# Patient Record
Sex: Female | Born: 1955 | Race: White | Hispanic: No | Marital: Married | Smoking: Never smoker
Health system: Southern US, Community
[De-identification: ages and names within clinical notes are randomized; demographics above are authoritative.]

## PROBLEM LIST (undated history)

## (undated) DIAGNOSIS — F419 Anxiety disorder, unspecified: Secondary | ICD-10-CM

## (undated) DIAGNOSIS — F329 Major depressive disorder, single episode, unspecified: Secondary | ICD-10-CM

## (undated) DIAGNOSIS — F32A Depression, unspecified: Secondary | ICD-10-CM

## (undated) HISTORY — DX: Anxiety disorder, unspecified: F41.9

## (undated) HISTORY — DX: Major depressive disorder, single episode, unspecified: F32.9

## (undated) HISTORY — PX: TOTAL ABDOMINAL HYSTERECTOMY: SHX209

## (undated) HISTORY — DX: Depression, unspecified: F32.A

---

## 2000-12-11 ENCOUNTER — Other Ambulatory Visit: Admission: RE | Admit: 2000-12-11 | Discharge: 2000-12-11 | Payer: Self-pay | Admitting: Gynecology

## 2002-01-07 ENCOUNTER — Other Ambulatory Visit: Admission: RE | Admit: 2002-01-07 | Discharge: 2002-01-07 | Payer: Self-pay | Admitting: Gynecology

## 2002-04-03 ENCOUNTER — Encounter: Payer: Self-pay | Admitting: Gynecology

## 2002-04-03 ENCOUNTER — Encounter: Admission: RE | Admit: 2002-04-03 | Discharge: 2002-04-03 | Payer: Self-pay | Admitting: Gynecology

## 2003-02-19 ENCOUNTER — Other Ambulatory Visit: Admission: RE | Admit: 2003-02-19 | Discharge: 2003-02-19 | Payer: Self-pay | Admitting: Gynecology

## 2004-05-03 ENCOUNTER — Other Ambulatory Visit: Admission: RE | Admit: 2004-05-03 | Discharge: 2004-05-03 | Payer: Self-pay | Admitting: Gynecology

## 2005-04-17 ENCOUNTER — Encounter: Admission: RE | Admit: 2005-04-17 | Discharge: 2005-04-17 | Payer: Self-pay | Admitting: Internal Medicine

## 2005-05-09 ENCOUNTER — Other Ambulatory Visit: Admission: RE | Admit: 2005-05-09 | Discharge: 2005-05-09 | Payer: Self-pay | Admitting: Gynecology

## 2005-08-10 ENCOUNTER — Ambulatory Visit: Payer: Self-pay | Admitting: Internal Medicine

## 2005-09-14 ENCOUNTER — Encounter (INDEPENDENT_AMBULATORY_CARE_PROVIDER_SITE_OTHER): Payer: Self-pay | Admitting: *Deleted

## 2005-09-14 ENCOUNTER — Ambulatory Visit: Payer: Self-pay | Admitting: Internal Medicine

## 2005-10-04 ENCOUNTER — Observation Stay (HOSPITAL_COMMUNITY): Admission: EM | Admit: 2005-10-04 | Discharge: 2005-10-05 | Payer: Self-pay | Admitting: Emergency Medicine

## 2005-10-04 ENCOUNTER — Ambulatory Visit: Payer: Self-pay | Admitting: Internal Medicine

## 2005-10-11 ENCOUNTER — Ambulatory Visit: Payer: Self-pay | Admitting: Internal Medicine

## 2005-11-07 ENCOUNTER — Ambulatory Visit: Payer: Self-pay | Admitting: Internal Medicine

## 2006-05-15 ENCOUNTER — Other Ambulatory Visit: Admission: RE | Admit: 2006-05-15 | Discharge: 2006-05-15 | Payer: Self-pay | Admitting: Gynecology

## 2007-07-08 ENCOUNTER — Other Ambulatory Visit: Admission: RE | Admit: 2007-07-08 | Discharge: 2007-07-08 | Payer: Self-pay | Admitting: Gynecology

## 2007-11-19 ENCOUNTER — Encounter: Payer: Self-pay | Admitting: Emergency Medicine

## 2007-11-19 ENCOUNTER — Ambulatory Visit: Payer: Self-pay | Admitting: *Deleted

## 2007-11-20 ENCOUNTER — Ambulatory Visit: Payer: Self-pay | Admitting: Cardiology

## 2007-11-20 ENCOUNTER — Inpatient Hospital Stay (HOSPITAL_COMMUNITY): Admission: EM | Admit: 2007-11-20 | Discharge: 2007-11-20 | Payer: Self-pay | Admitting: Cardiology

## 2007-11-25 ENCOUNTER — Encounter: Admission: RE | Admit: 2007-11-25 | Discharge: 2008-01-21 | Payer: Self-pay | Admitting: Surgery

## 2007-12-04 ENCOUNTER — Encounter: Payer: Self-pay | Admitting: Cardiology

## 2007-12-04 ENCOUNTER — Ambulatory Visit: Payer: Self-pay

## 2008-01-27 ENCOUNTER — Ambulatory Visit: Payer: Self-pay | Admitting: Cardiology

## 2008-12-28 ENCOUNTER — Ambulatory Visit: Payer: Self-pay | Admitting: Internal Medicine

## 2008-12-31 ENCOUNTER — Telehealth: Payer: Self-pay | Admitting: Internal Medicine

## 2009-01-07 ENCOUNTER — Encounter: Payer: Self-pay | Admitting: Internal Medicine

## 2009-01-07 ENCOUNTER — Ambulatory Visit: Payer: Self-pay | Admitting: Internal Medicine

## 2009-01-12 ENCOUNTER — Encounter: Payer: Self-pay | Admitting: Internal Medicine

## 2010-09-05 ENCOUNTER — Other Ambulatory Visit: Payer: Self-pay | Admitting: Gynecology

## 2010-09-21 ENCOUNTER — Other Ambulatory Visit (HOSPITAL_COMMUNITY): Payer: Self-pay | Admitting: Urology

## 2010-09-21 DIAGNOSIS — R3129 Other microscopic hematuria: Secondary | ICD-10-CM

## 2010-09-29 ENCOUNTER — Ambulatory Visit (HOSPITAL_COMMUNITY)
Admission: RE | Admit: 2010-09-29 | Discharge: 2010-09-29 | Disposition: A | Discharge status: Home / Self Care | Payer: BC Managed Care – PPO | Source: Ambulatory Visit | Attending: Urology | Admitting: Urology

## 2010-09-29 DIAGNOSIS — N289 Disorder of kidney and ureter, unspecified: Secondary | ICD-10-CM | POA: Insufficient documentation

## 2010-09-29 DIAGNOSIS — K8020 Calculus of gallbladder without cholecystitis without obstruction: Secondary | ICD-10-CM | POA: Insufficient documentation

## 2010-09-29 DIAGNOSIS — R3129 Other microscopic hematuria: Secondary | ICD-10-CM

## 2010-09-29 MED ORDER — GADOBENATE DIMEGLUMINE 529 MG/ML IV SOLN
14.0000 mL | Freq: Once | INTRAVENOUS | Status: AC | PRN
  Administered 2010-09-29: 14 mL via INTRAVENOUS

## 2010-12-12 NOTE — Discharge Summary (Signed)
Natalie Little, Natalie Little                ACCOUNT NO.:  [id number]   MEDICAL RECORD NO.:  [id number]          PATIENT TYPE:  INP   LOCATION:  4731                         FACILITY:  [facility]   PHYSICIAN:  Jonelle Sidle, MD DATE OF BIRTH:  03-01-1956   DATE OF ADMISSION:  11/20/2007  DATE OF DISCHARGE:  11/20/2007                               DISCHARGE SUMMARY   PROCEDURES:  None.   PRIMARY FINAL DISCHARGE DIAGNOSIS:  Chest pain, cardiac enzymes negative  for myocardial infarction.   SECONDARY DIAGNOSES:  1. Hyponatremia.  2. Bipolar disorder.  3. Family history of coronary artery disease.   TIME OF DISCHARGE:  35 minutes.   HOSPITAL COURSE:  [name] is a 55 year old female with no previous  history of coronary artery disease.  She had chest pain and came to the  emergency room.  The chest pain started after dinner, and although the  pain was decreasing, it resolved completely after 1 sublingual  nitroglycerin.  She was admitted overnight for observation.   Her cardiac enzymes were negative for MI.  Her sodium was slightly low  on admission at 130, but a recheck was 140.  She had no other  significant laboratory abnormalities except for a BUN slightly low at 5.  Cardiac enzymes were all within normal limits, and she was not anemic  and had no elevation in her white count.  LFTs performed with a c-Met  were also within normal limits.  A chest x-ray showed no acute disease.  Her vital signs were stable.   Dr. Diona Browner evaluated Ms. Depaula and felt that since her symptoms had  resolved and her cardiac enzymes were negative, she could be safely  discharged home with close outpatient followup and stress testing.   DISCHARGE INSTRUCTIONS:  Her activity level is to be increased  gradually.  She is to stick to a low-sodium heart-healthy diet.  She is  to follow up in our office on Dec 02, 2007, with a stress test on the  treadmill at 7:30 and an echocardiogram at 10:00 a.m.  She is to  follow  up with Dr. Diona Browner on Dec 24, 2007, at 3:45.  She is to follow up with  Dr. Eloise Harman as needed.   DISCHARGE MEDICATIONS:  1. Aspirin 81 mg daily.  2. Protonix 40 mg a day.  3. Detrol 4 mg daily.  4. BuSpar 30 mg b.i.d.  5. Wellbutrin XL 150 mg 1 or 2 tablets a day.  6. Neurontin 100-500 mg 2 tablets a.m. and 3 tablets p.m.  7. Klonopin 5 mg a half tablet a.m. and a whole tablet p.m.  8. Lamictal 25 mg 1 tablet a.m., 2 tablets p.m.  9. Depakote ER b.i.d.  10.Citracal daily.      Theodore Demark, PA-C      Jonelle Sidle, MD  Electronically Signed    RB/MEDQ  D:  11/20/2007  T:  11/21/2007  Job:  [id number]   cc:   Barry Dienes. Eloise Harman, M.D.

## 2010-12-12 NOTE — Assessment & Plan Note (Signed)
[facility]                            CARDIOLOGY OFFICE NOTE   Natalie Little, Natalie Little                       MRN:          [id number]  DATE:01/27/2008                            DOB:          27-Aug-1955    PRIMARY CARE PHYSICIAN:  Barry Dienes. Eloise Harman, M.D.   REASON FOR VISIT:  Followup cardiac testing.   HISTORY OF PRESENT ILLNESS:  I saw [name] during an admission to the  hospital on October 30, 2007.  She presented at that time with chest pain  and ruled out for myocardial infarction.  She was referred for followup  ischemic testing, including a Myoview, which demonstrated no  electrocardiographic changes to suggest ischemia and overall normal  perfusion imaging with an ejection fraction of 66%.  Echocardiography  also demonstrated normal left ventricular systolic function and only  trivial mitral regurgitation.  Symptomatically, she states that she has  not had any further chest pain.  It is not entirely clear what led to  her symptoms, although reflux or perhaps even esophageal spasm could be  considered.  Her cardiac testing has been very reassuring.   ALLERGIES:  No known drug allergies.   PRESENT MEDICATIONS:  1. Wellbutrin XL 150 mg p.o. daily.  2. Neurontin 500 mg p.o. daily.  3. Lamictal 125 mg p.o. daily.  4. Depakote 1000 mg p.o. daily.  5. Calcium with vitamin D 1000 mg p.o. daily.  6. Lamictal 50 mg p.o. nightly.  7. BuSpar 30 mg one-half tablet p.o. b.i.d.  8. Klonopin 5 mg p.o. q.p.m. and 2.5 mg p.o. q.a.m.  9. Pantoprazole 40 mg p.o. daily.   REVIEW OF SYSTEMS:  As described in History of Present Illness,  otherwise negative.   PHYSICAL EXAMINATION:  Blood pressure is 120/60, heart rate is 80, and  weight is 155 pounds.  The patient is comfortable and in no acute distress.  NECK:  No elevated jugular venous pressure.  No loud bruits.  LUNGS:  Clear without labored breathing at rest.  CARDIAC:  Regular rate and rhythm.  No loud  murmur or gallop.  EXTREMITIES:  No pitting edema.   IMPRESSION AND RECOMMENDATIONS:  History of chest pain, resolved, and  now status post reassuring cardiac evaluation, including a normal  exercise Myoview and essentially normal  echocardiogram.  We do not anticipate any further cardiac evaluation at  this point.  She will continue to follow with Dr. Eloise Harman, and  Cardiology followup can be as needed.     Jonelle Sidle, MD  Electronically Signed    SGM/MedQ  DD: 01/27/2008  DT: 01/28/2008  Job #: [id number]   cc:   Barry Dienes. Eloise Harman, M.D.

## 2010-12-12 NOTE — H&P (Signed)
Natalie Little, Natalie Little                ACCOUNT NO.:  [id number]   MEDICAL RECORD NO.:  [id number]          PATIENT TYPE:  EMS   LOCATION:  ED                           FACILITY:  [facility]   PHYSICIAN:  Rod Holler, MD     DATE OF BIRTH:  February 19, 1956   DATE OF ADMISSION:  11/19/2007  DATE OF DISCHARGE:                              HISTORY & PHYSICAL   CHIEF COMPLAINT:  Chest pain.   HISTORY OF PRESENT ILLNESS:  Ms. Westby is a very pleasant 55 year old  female who presented to the emergency department with complaints of  chest pain.  After dinner tonight while cleaning up, the patient had  onset of mid chest pressure that she described as a dull ache.  There  was no associated shortness of breath, nausea or diaphoresis.  She also  had some mild mid back discomfort.  This lasted for approximately one  hour and was resolving.  By the time she had reached the emergency  department, though, it completely resolved after one sublingual  nitroglycerin.  Currently, the patient is chest pain free.  She has had  no recent anginal symptoms.  She has had no recent paroxysmal nocturnal  dyspnea or orthopnea, no lower extremity swelling, no syncope or  presyncope, no palpitations.   PAST MEDICAL HISTORY:  Bipolar disorder.   MEDICATIONS:  1. Buspar 15 mg by mouth twice daily.  2. Depakote 500 mg by mouth twice daily.  3. Detrol 4 mg by mouth daily.  4. Klonopin 2.5 mg in the morning, 5 mg in the evening.  5. Lamictal 125 mg in the morning, 50 mg in the evening.  6. Multivitamin daily.  7. Neurontin 500 mg in the morning, 200 mg at night.  8. Wellbutrin 150 mg by mouth daily.   ALLERGIES:  No known drug allergies.   SOCIAL HISTORY:  The patient is a Runner, broadcasting/film/video, is a nonsmoker.   FAMILY HISTORY:  Brother with history of coronary artery bypass grafting  in his 67s, mother with history of coronary artery disease in her 62s.   REVIEW OF SYSTEMS:  All systems are reviewed in detail and are negative  except as noted in history of present illness.   PHYSICAL EXAMINATION:  VITAL SIGNS:  Temperature 97.8, blood pressure  115/67, heart rate 76, respiratory rate 18, oxygen saturation 100%.  GENERAL:  Well-developed, well-nourished female alert and oriented x3,  no apparent distress.  HEENT:  Normocephalic and atraumatic.  Pupils equal, round and reactive  to light. Extraocular movements are intact.  NECK:  Supple with no adenopathy, no jugular venous distention.  CHEST:  Lungs clear to auscultation bilaterally with equal bilateral  breath sounds.  CARDIOVASCULAR:  Regular rhythm, normal rate, normal S1 and S2, no  murmurs, rubs or gallops.  ABDOMEN:  Soft, nontender, nondistended.  EXTREMITIES:  No cyanosis, clubbing or edema.  NEUROLOGIC:  No focal deficits.   LABORATORY DATA:  Chest x-ray shows no acute abnormalities.  EKG shows  normal sinus rhythm with less than 1 mm of ST segment depression  laterally.  White blood cell count 6.5,  hematocrit 36, platelet count 296.  CK-MB  1.3.  Troponin less than 0.05.  Myoglobin 23, sodium 130, potassium 3.9,  chloride 95, bicarb 26, BUN 7, creatinine 0.5, glucose 93.   IMPRESSION/PLAN:  A 56 year old female with no significant  cardiovascular risk factors who presents with an episode of chest pain  and less than 1 mm of ST segment depression laterally, with negative  cardiac enzymes.   PLAN:  1. Cardiovascular:  Admit the patient to the telemetry bed at [facility], aspirin daily, Lipitor daily, blood pressure under good      control without medicines.  Heparin bolus and drip per the pharmacy      protocol, serial cardiac enzymes, daily EKG.  2. Fluids, electrolytes and nutrition:  The patient will be n.p.o.  3. Psychiatric:  Continue home psychiatric medicines.  4. GI:  Protonix 40 mg by mouth daily.      Rod Holler, MD  Electronically Signed     TRK/MEDQ  D:  11/20/2007  T:  11/20/2007  Job:  [id number]

## 2011-04-20 ENCOUNTER — Other Ambulatory Visit: Payer: Self-pay | Admitting: Urology

## 2011-04-20 DIAGNOSIS — N281 Cyst of kidney, acquired: Secondary | ICD-10-CM

## 2011-04-24 LAB — CBC
HCT: 36.6
Hemoglobin: 12.3
Hemoglobin: 12.3
MCHC: 34
Platelets: 290
Platelets: 296
RDW: 14
RDW: 14.4
WBC: 6.5

## 2011-04-24 LAB — COMPREHENSIVE METABOLIC PANEL
ALT: 16
Albumin: 3.7
Alkaline Phosphatase: 48
Chloride: 95 — ABNORMAL LOW
Glucose, Bld: 93
Potassium: 3.9
Sodium: 130 — ABNORMAL LOW
Total Bilirubin: 0.7
Total Protein: 6

## 2011-04-24 LAB — BASIC METABOLIC PANEL
BUN: 5 — ABNORMAL LOW
CO2: 22
Calcium: 8.6
Creatinine, Ser: 0.49
Glucose, Bld: 97

## 2011-04-24 LAB — DIFFERENTIAL
Basophils Absolute: 0
Basophils Relative: 0
Eosinophils Absolute: 0.1
Monocytes Absolute: 0.6
Monocytes Relative: 9

## 2011-04-24 LAB — CARDIAC PANEL(CRET KIN+CKTOT+MB+TROPI)
CK, MB: 2.2
Total CK: 74

## 2011-04-24 LAB — LIPID PANEL
Cholesterol: 136
LDL Cholesterol: 87
Total CHOL/HDL Ratio: 3.3

## 2011-04-24 LAB — PROTIME-INR: INR: 1

## 2011-04-24 LAB — TROPONIN I: Troponin I: 0.05

## 2011-04-24 LAB — POCT CARDIAC MARKERS
Myoglobin, poc: 23.1
Operator id: 4531

## 2011-04-24 LAB — APTT: aPTT: 37

## 2011-04-24 LAB — CK TOTAL AND CKMB (NOT AT ARMC): Total CK: 76

## 2011-04-24 LAB — MAGNESIUM: Magnesium: 2.2

## 2011-04-30 ENCOUNTER — Other Ambulatory Visit (HOSPITAL_COMMUNITY): Payer: BC Managed Care – PPO

## 2011-04-30 ENCOUNTER — Ambulatory Visit (HOSPITAL_COMMUNITY)
Admission: RE | Admit: 2011-04-30 | Discharge: 2011-04-30 | Disposition: A | Discharge status: Home / Self Care | Payer: BC Managed Care – PPO | Source: Ambulatory Visit | Attending: Urology | Admitting: Urology

## 2011-04-30 DIAGNOSIS — N289 Disorder of kidney and ureter, unspecified: Secondary | ICD-10-CM | POA: Insufficient documentation

## 2011-04-30 DIAGNOSIS — N281 Cyst of kidney, acquired: Secondary | ICD-10-CM

## 2011-04-30 MED ORDER — GADOBENATE DIMEGLUMINE 529 MG/ML IV SOLN
15.0000 mL | Freq: Once | INTRAVENOUS | Status: AC | PRN
  Administered 2011-04-30: 14 mL via INTRAVENOUS

## 2011-05-09 ENCOUNTER — Other Ambulatory Visit: Payer: Self-pay | Admitting: Dermatology

## 2011-09-07 ENCOUNTER — Other Ambulatory Visit: Payer: Self-pay | Admitting: Gynecology

## 2011-10-10 ENCOUNTER — Encounter: Payer: Self-pay | Admitting: Internal Medicine

## 2012-07-01 ENCOUNTER — Ambulatory Visit
Admission: RE | Admit: 2012-07-01 | Discharge: 2012-07-01 | Disposition: A | Discharge status: Home / Self Care | Payer: BC Managed Care – PPO | Source: Ambulatory Visit | Attending: Chiropractic Medicine | Admitting: Chiropractic Medicine

## 2012-07-01 ENCOUNTER — Other Ambulatory Visit: Payer: Self-pay | Admitting: Chiropractic Medicine

## 2012-07-01 DIAGNOSIS — M545 Low back pain: Secondary | ICD-10-CM

## 2012-10-08 ENCOUNTER — Encounter: Payer: Self-pay | Admitting: Internal Medicine

## 2012-10-20 ENCOUNTER — Ambulatory Visit (AMBULATORY_SURGERY_CENTER): Payer: BC Managed Care – PPO | Admitting: *Deleted

## 2012-10-20 ENCOUNTER — Encounter: Payer: Self-pay | Admitting: Internal Medicine

## 2012-10-20 VITALS — Ht 65.0 in | Wt 157.0 lb

## 2012-10-20 DIAGNOSIS — Z1211 Encounter for screening for malignant neoplasm of colon: Secondary | ICD-10-CM

## 2012-10-20 MED ORDER — MOVIPREP 100 G PO SOLR: ORAL | Status: DC

## 2012-11-07 ENCOUNTER — Encounter: Payer: Self-pay | Admitting: Internal Medicine

## 2012-11-07 ENCOUNTER — Ambulatory Visit (AMBULATORY_SURGERY_CENTER): Payer: BC Managed Care – PPO | Admitting: Internal Medicine

## 2012-11-07 VITALS — BP 114/66 | HR 65 | Temp 97.4°F | Resp 16 | Ht 65.0 in | Wt 157.0 lb

## 2012-11-07 DIAGNOSIS — Z1211 Encounter for screening for malignant neoplasm of colon: Secondary | ICD-10-CM

## 2012-11-07 MED ORDER — SODIUM CHLORIDE 0.9 % IV SOLN: 500.0000 mL | INTRAVENOUS | Status: DC

## 2012-11-07 NOTE — Op Note (Signed)
[facility] [address] [place], [address]   COLONOSCOPY PROCEDURE REPORT  PATIENT: Natalie Little, Natalie Little  MR#: [id number] BIRTHDATE: 03-28-1956 , 56  yrs. old GENDER: Female ENDOSCOPIST: Hart Carwin, MD REFERRED BY:  Jarome Matin, M.D. 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 MEDICATIONS: MAC sedation, administered by CRNA and propofol (Diprivan) 200mg  IV  DESCRIPTION OF PROCEDURE:   After the risks and benefits and of the procedure were explained, informed consent was obtained.  A digital rectal exam revealed no abnormalities of the rectum.    The LB [id number]-[id number]  endoscope was introduced through the anus and advanced to the cecum, which was identified by both the appendix and ileocecal valve .  The quality of the prep was good, using MoviPrep .  The instrument was then slowly withdrawn as the colon was fully examined.     COLON FINDINGS: A normal appearing cecum, ileocecal valve, and appendiceal orifice were identified.  The ascending, hepatic flexure, transverse, splenic flexure, descending, sigmoid colon and rectum appeared unremarkable.  No polyps or cancers were seen. Mild diverticulosis was noted in the sigmoid colon.     Retroflexed views revealed no abnormalities.     The scope was then withdrawn from the patient and the procedure completed.  COMPLICATIONS: There were no complications. ENDOSCOPIC IMPRESSION: Normal colon mild sigmoid diverticulosis  RECOMMENDATIONS: High fiber diet   REPEAT EXAM: In 10 year(s)  for Colonoscopy.  cc:  _______________________________ eSignedHart Carwin, MD 11/07/2012 11:44 AM

## 2012-11-07 NOTE — Progress Notes (Signed)
Patient did not experience any of the following events: a burn prior to discharge; a fall within the facility; wrong site/side/patient/procedure/implant event; or a hospital transfer or hospital admission upon discharge from the facility. (G8907) Patient did not have preoperative order for IV antibiotic SSI prophylaxis. (G8918)  

## 2012-11-07 NOTE — Patient Instructions (Addendum)
Discharge instructions given with verbal understanding. Handouts on diverticulosis and a high fiber diet given. Resume previous medications.YOU HAD AN ENDOSCOPIC PROCEDURE TODAY AT THE [facility]: Refer to the procedure report that was given to you for any specific questions about what was found during the examination.  If the procedure report does not answer your questions, please call your gastroenterologist to clarify.  If you requested that your care partner not be given the details of your procedure findings, then the procedure report has been included in a sealed envelope for you to review at your convenience later.  YOU SHOULD EXPECT: Some feelings of bloating in the abdomen. Passage of more gas than usual.  Walking can help get rid of the air that was put into your GI tract during the procedure and reduce the bloating. If you had a lower endoscopy (such as a colonoscopy or flexible sigmoidoscopy) you may notice spotting of blood in your stool or on the toilet paper. If you underwent a bowel prep for your procedure, then you may not have a normal bowel movement for a few days.  DIET: Your first meal following the procedure should be a light meal and then it is ok to progress to your normal diet.  A half-sandwich or bowl of soup is an example of a good first meal.  Heavy or fried foods are harder to digest and may make you feel nauseous or bloated.  Likewise meals heavy in dairy and vegetables can cause extra gas to form and this can also increase the bloating.  Drink plenty of fluids but you should avoid alcoholic beverages for 24 hours.  ACTIVITY: Your care partner should take you home directly after the procedure.  You should plan to take it easy, moving slowly for the rest of the day.  You can resume normal activity the day after the procedure however you should NOT DRIVE or use heavy machinery for 24 hours (because of the sedation medicines used during the test).    SYMPTOMS TO  REPORT IMMEDIATELY: A gastroenterologist can be reached at any hour.  During normal business hours, 8:30 AM to 5:00 PM Monday through Friday, call [phone].  After hours and on weekends, please call the GI answering service at [phone] who will take a message and have the physician on call contact you.   Following lower endoscopy (colonoscopy or flexible sigmoidoscopy):  Excessive amounts of blood in the stool  Significant tenderness or worsening of abdominal pains  Swelling of the abdomen that is new, acute  Fever of 100F or higher  FOLLOW UP: If any biopsies were taken you will be contacted by phone or by letter within the next 1-3 weeks.  Call your gastroenterologist if you have not heard about the biopsies in 3 weeks.  Our staff will call the home number listed on your records the next business day following your procedure to check on you and address any questions or concerns that you may have at that time regarding the information given to you following your procedure. This is a courtesy call and so if there is no answer at the home number and we have not heard from you through the emergency physician on call, we will assume that you have returned to your regular daily activities without incident.  SIGNATURES/CONFIDENTIALITY: You and/or your care partner have signed paperwork which will be entered into your electronic medical record.  These signatures attest to the fact that that the information above on your   After Visit Summary has been reviewed and is understood.  Full responsibility of the confidentiality of this discharge information lies with you and/or your care-partner.   

## 2012-11-10 ENCOUNTER — Telehealth: Payer: Self-pay | Admitting: *Deleted

## 2012-11-10 NOTE — Telephone Encounter (Signed)
  Follow up Call-  Call back number 11/07/2012  Post procedure Call Back phone  # [phone]  Permission to leave phone message Yes     Patient questions:  Do you have a fever, pain , or abdominal swelling? no Pain Score  0 *  Have you tolerated food without any problems? yes  Have you been able to return to your normal activities? yes  Do you have any questions about your discharge instructions: Diet   no Medications  no Follow up visit  no  Do you have questions or concerns about your Care? no  Actions: * If pain score is 4 or above: No action needed, pain <4.

## 2013-11-16 ENCOUNTER — Other Ambulatory Visit: Payer: Self-pay | Admitting: *Deleted

## 2013-11-16 ENCOUNTER — Ambulatory Visit
Admission: RE | Admit: 2013-11-16 | Discharge: 2013-11-16 | Disposition: A | Discharge status: Home / Self Care | Payer: BC Managed Care – PPO | Source: Ambulatory Visit | Attending: *Deleted | Admitting: *Deleted

## 2013-11-16 DIAGNOSIS — M542 Cervicalgia: Secondary | ICD-10-CM

## 2013-11-16 DIAGNOSIS — M549 Dorsalgia, unspecified: Secondary | ICD-10-CM

## 2014-03-08 ENCOUNTER — Encounter: Payer: Self-pay | Admitting: Internal Medicine
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
## 2015-07-07 ENCOUNTER — Encounter: Payer: Self-pay | Admitting: Cardiology

## 2016-12-28 ENCOUNTER — Encounter: Payer: Self-pay | Admitting: Podiatry

## 2016-12-28 ENCOUNTER — Ambulatory Visit (INDEPENDENT_AMBULATORY_CARE_PROVIDER_SITE_OTHER): Payer: BC Managed Care – PPO | Admitting: Podiatry

## 2016-12-28 VITALS — BP 114/88

## 2016-12-28 DIAGNOSIS — M779 Enthesopathy, unspecified: Secondary | ICD-10-CM

## 2016-12-28 DIAGNOSIS — M21619 Bunion of unspecified foot: Secondary | ICD-10-CM | POA: Diagnosis not present

## 2016-12-28 DIAGNOSIS — Q828 Other specified congenital malformations of skin: Secondary | ICD-10-CM

## 2016-12-28 MED ORDER — TRIAMCINOLONE ACETONIDE 10 MG/ML IJ SUSP
10.0000 mg | Freq: Once | INTRAMUSCULAR | Status: AC
Start: 2016-12-28 — End: 2016-12-28
  Administered 2016-12-28: 10 mg

## 2016-12-28 NOTE — Progress Notes (Signed)
Subjective:    Patient ID: Natalie Little, female   DOB: 61 y.o.   MRN: [id number]   HPI patient presents with nodule plantar aspect left fifth metatarsal that's painful when pressed with fluid buildup around it that is causing difficulty with walking. Patient has no other history changes    Review of Systems  All other systems reviewed and are negative.       Objective:  Physical Exam  Cardiovascular: Intact distal pulses.   Musculoskeletal: Normal range of motion.  Neurological: She is alert.  Skin: Skin is warm.  Nursing note and vitals reviewed.  neurovascular status intact muscle strength adequate range of motion within normal limits with patient noted to have inflammatory capsulitis fifth MPJ left that's painful with lesion formation and plantarflexed metatarsal with good digital perfusion well oriented 3     Assessment:    Inflammatory capsulitis with prominence of the left fifth MPJ and keratotic tissue formation that's painful when pressed     Plan:    H&P and education rendered concerning condition and today I did a capsular injection 3 mg Dexon some Kenalog 5 mill grams Xylocaine and after appropriate numbness debris did lesion fully with sterile sharp instrumentation and applied padding with medication to try to soften the area. Reappoint to recheck

## 2016-12-28 NOTE — Progress Notes (Signed)
   Subjective:    Patient ID: Natalie FredericksonRebecca H Kirkland, female    DOB: 06/22/1956, 61 y.o.   MRN: [id number]  HPI  Chief Complaint  Patient presents with  . Callouses    Lt foot have hard skin on ball of the foot been painful for about 1 year.     Review of Systems  Musculoskeletal: Positive for back pain.  All other systems reviewed and are negative.      Objective:   Physical Exam        Assessment & Plan:

## 2019-02-05 ENCOUNTER — Ambulatory Visit: Payer: BC Managed Care – PPO | Admitting: Podiatry

## 2019-02-16 ENCOUNTER — Encounter: Payer: Self-pay | Admitting: Podiatry

## 2019-02-16 ENCOUNTER — Other Ambulatory Visit: Payer: Self-pay

## 2019-02-16 ENCOUNTER — Ambulatory Visit (INDEPENDENT_AMBULATORY_CARE_PROVIDER_SITE_OTHER): Payer: Medicare Other | Admitting: Podiatry

## 2019-02-16 VITALS — Temp 98.0°F

## 2019-02-16 DIAGNOSIS — L84 Corns and callosities: Secondary | ICD-10-CM

## 2019-02-16 DIAGNOSIS — M779 Enthesopathy, unspecified: Secondary | ICD-10-CM

## 2019-02-16 NOTE — Progress Notes (Signed)
Subjective:   Patient ID: Natalie Little, female   DOB: 63 y.o.   MRN: [id number]   HPI Patient presents stating having a lot of pain underneath the left foot and states that it feels like it did several years ago   ROS      Objective:  Physical Exam  Neurovascular status intact with inflammation pain of the left fifth MPJ with fluid buildup and keratotic tissue formation     Assessment:  Inflammatory capsulitis fifth MPJ left with pain and lesion formation     Plan:  H&P reviewed condition and today did sterile prep injected the plantar capsule with 2 mg Dexasone Kenalog 5 mg Xylocaine and did full debridement of lesions no iatrogenic bleeding and reappoint if symptoms indicate

## 2019-09-17 ENCOUNTER — Ambulatory Visit: Payer: BC Managed Care – PPO

## 2019-09-21 ENCOUNTER — Ambulatory Visit: Payer: BC Managed Care – PPO | Attending: Family

## 2019-09-21 DIAGNOSIS — Z23 Encounter for immunization: Secondary | ICD-10-CM | POA: Insufficient documentation

## 2019-09-21 NOTE — Progress Notes (Signed)
   Covid-19 Vaccination Clinic  Name:  Natalie Little    MRN: [id number] DOB: 07-25-1956  09/21/2019  [name] was observed post Covid-19 immunization for 15 minutes without incidence. She was provided with Vaccine Information Sheet and instruction to access the V-Safe system.   [name] was instructed to call 911 with any severe reactions post vaccine: Marland Kitchen Difficulty breathing  . Swelling of your face and throat  . A fast heartbeat  . A bad rash all over your body  . Dizziness and weakness    Immunizations Administered    Name Date Dose VIS Date Route   Moderna COVID-19 Vaccine 09/21/2019  1:09 PM 0.5 mL 06/30/2019 Intramuscular   Manufacturer: Moderna   Lot: [id number]   NDC: 44830-159-96

## 2019-10-27 ENCOUNTER — Ambulatory Visit: Payer: BC Managed Care – PPO | Attending: Family

## 2019-10-27 DIAGNOSIS — Z23 Encounter for immunization: Secondary | ICD-10-CM

## 2019-10-27 NOTE — Progress Notes (Signed)
   Covid-19 Vaccination Clinic  Name:  Natalie Little    MRN: [id number] DOB: 1955/12/19  10/27/2019  Ms. Natalie Little was observed post Covid-19 immunization for 15 minutes without incident. She was provided with Vaccine Information Sheet and instruction to access the V-Safe system.   Ms. Natalie Little was instructed to call 911 with any severe reactions post vaccine: Marland Kitchen Difficulty breathing  . Swelling of face and throat  . A fast heartbeat  . A bad rash all over body  . Dizziness and weakness   Immunizations Administered    Name Date Dose VIS Date Route   Moderna COVID-19 Vaccine 10/27/2019 11:18 AM 0.5 mL 06/30/2019 Intramuscular   Manufacturer: Moderna   Lot: [id number]   NDC: 62831-517-61

## 2020-08-06 ENCOUNTER — Other Ambulatory Visit: Payer: BC Managed Care – PPO

## 2020-08-06 DIAGNOSIS — Z20822 Contact with and (suspected) exposure to covid-19: Secondary | ICD-10-CM

## 2020-08-09 LAB — NOVEL CORONAVIRUS, NAA: SARS-CoV-2, NAA: NOT DETECTED

## 2021-02-08 ENCOUNTER — Ambulatory Visit: Payer: Medicare Other | Admitting: Podiatry

## 2021-12-18 DIAGNOSIS — N281 Cyst of kidney, acquired: Secondary | ICD-10-CM | POA: Diagnosis not present

## 2021-12-18 DIAGNOSIS — R3121 Asymptomatic microscopic hematuria: Secondary | ICD-10-CM | POA: Diagnosis not present

## 2022-01-05 DIAGNOSIS — H5203 Hypermetropia, bilateral: Secondary | ICD-10-CM | POA: Diagnosis not present

## 2022-01-05 DIAGNOSIS — H2513 Age-related nuclear cataract, bilateral: Secondary | ICD-10-CM | POA: Diagnosis not present

## 2022-12-29 DIAGNOSIS — H269 Unspecified cataract: Secondary | ICD-10-CM

## 2022-12-29 HISTORY — DX: Unspecified cataract: H26.9

## 2022-12-29 HISTORY — PX: CATARACT EXTRACTION, BILATERAL: SHX1313

## 2023-01-09 ENCOUNTER — Other Ambulatory Visit: Payer: Self-pay

## 2023-01-09 ENCOUNTER — Emergency Department (HOSPITAL_BASED_OUTPATIENT_CLINIC_OR_DEPARTMENT_OTHER)
Admission: EM | Admit: 2023-01-09 | Discharge: 2023-01-09 | Disposition: A | Discharge status: Home / Self Care | Payer: Medicare Other | Attending: Emergency Medicine | Admitting: Emergency Medicine

## 2023-01-09 ENCOUNTER — Emergency Department (HOSPITAL_BASED_OUTPATIENT_CLINIC_OR_DEPARTMENT_OTHER): Payer: Medicare Other

## 2023-01-09 ENCOUNTER — Encounter (HOSPITAL_BASED_OUTPATIENT_CLINIC_OR_DEPARTMENT_OTHER): Payer: Self-pay | Admitting: Emergency Medicine

## 2023-01-09 DIAGNOSIS — Z23 Encounter for immunization: Secondary | ICD-10-CM | POA: Diagnosis not present

## 2023-01-09 DIAGNOSIS — W268XXA Contact with other sharp object(s), not elsewhere classified, initial encounter: Secondary | ICD-10-CM | POA: Insufficient documentation

## 2023-01-09 DIAGNOSIS — S90851A Superficial foreign body, right foot, initial encounter: Secondary | ICD-10-CM | POA: Insufficient documentation

## 2023-01-09 MED ORDER — LIDOCAINE HCL (PF) 1 % IJ SOLN
5.0000 mL | Freq: Once | INTRAMUSCULAR | Status: AC
  Administered 2023-01-09: 5 mL
  Filled 2023-01-09: qty 5

## 2023-01-09 MED ORDER — HYDROCODONE-ACETAMINOPHEN 5-325 MG PO TABS: 1.0000 | ORAL_TABLET | Freq: Four times a day (QID) | ORAL | 0 refills | Status: DC | PRN

## 2023-01-09 MED ORDER — TETANUS-DIPHTH-ACELL PERTUSSIS 5-2.5-18.5 LF-MCG/0.5 IM SUSY
0.5000 mL | PREFILLED_SYRINGE | Freq: Once | INTRAMUSCULAR | Status: AC
  Administered 2023-01-09: 0.5 mL via INTRAMUSCULAR
  Filled 2023-01-09: qty 0.5

## 2023-01-09 MED ORDER — CEPHALEXIN 500 MG PO CAPS: 500.0000 mg | ORAL_CAPSULE | Freq: Four times a day (QID) | ORAL | 0 refills | Status: DC

## 2023-01-09 NOTE — ED Notes (Signed)
Reviewed AVS/discharge instruction with patient. Time allotted for and all questions answered. Patient is agreeable for d/c and escorted to ed exit by staff.  

## 2023-01-09 NOTE — ED Triage Notes (Signed)
Pt here sent from her md after stepping on a sewing needle  today in the right foot

## 2023-01-09 NOTE — Discharge Instructions (Addendum)
Thank you for allowing me to be a part of your care today.    The needle that was inside of your right foot was successfully removed.  In order to do this, a small incision was made in the sole of your foot.  This incision was closed with 2 nonabsorbable sutures.  You will need to return to the ED or an urgent care to have these removed in 7 days.  Keep the wound dry today, but you may resume normal bathing tomorrow.  I do recommend leaving the area covered with antibiotic ointment and a bandage.  I have sent over an antibiotic and a pain medicine to the pharmacy.  Please take the antibiotic for the next 5 days to prevent infection.  If you have mild to moderate pain, I recommend trying Tylenol or ibuprofen first.  Monitor your wound for signs of infection including redness, swelling, increased warmth, increased pain, or drainage from the site.  If you develop signs of infection, please have your foot reevaluated as soon as possible.

## 2023-01-09 NOTE — ED Provider Notes (Signed)
[place] EMERGENCY DEPARTMENT AT [facility] Provider Note   CSN: [id number] Arrival date & time: 01/09/23  1701     History  Chief Complaint  Patient presents with   Foot Injury    Natalie Little is a 67 y.o. female with noncontributory past medical history presents to the ED after stepping on a sewing needle earlier today.  Patient was sent from her doctor's office.  Patient states that she stepped on a sewing needle with her right foot at home and it broke off in her foot.  She states her last tetanus shot was "a long time ago" and would like to update that today.  Patient had cataract surgery earlier today and was advised to be placed on antibiotics by her eye doctor.  Denies numbness or tingling in the foot.  She does not have a history of poor wound healing or diabetes.       Home Medications Prior to Admission medications   Medication Sig Start Date End Date Taking? Authorizing Provider  cephALEXin (KEFLEX) 500 MG capsule Take 1 capsule (500 mg total) by mouth 4 (four) times daily. 01/09/23  Yes Dalyce Renne R, PA-C  HYDROcodone-acetaminophen (NORCO/VICODIN) 5-325 MG tablet Take 1-2 tablets by mouth every 6 (six) hours as needed. 01/09/23  Yes Darrall Strey R, PA-C  BIOTIN PO Take 1 tablet by mouth daily.    [provider]  busPIRone (BUSPAR) 30 MG tablet Take 30 mg by mouth. Takes 15mg  in the morning , 15 mg at noon and 10mg  at night    [provider]  butalbital-aspirin-caffeine [facility]) 50-325-40 MG per capsule Take 2 capsules by mouth as needed for migraine.    [provider]  ClonazePAM (KLONOPIN PO) Take 0.5 mg by mouth 2 (two) times daily. Takes .25-.5mg     [provider]  divalproex (DEPAKOTE) 500 MG DR tablet Take 500 mg by mouth 2 (two) times daily.    [provider]  estradiol (VIVELLE-DOT) 0.0375 MG/24HR Place 1 patch onto the skin 2 (two) times a week.    [provider]  gabapentin (NEURONTIN)  100 MG capsule Take 200 mg by mouth every morning. Takes 200mg  in the morning and 300mg  at bedtime    [provider]  lamoTRIgine (LAMICTAL) 25 MG tablet Take 25 mg by mouth. 125mg  in the morning and 50mg  in the evening    [provider]  solifenacin (VESICARE) 5 MG tablet Take 5 mg by mouth daily.    [provider]      Allergies    Patient has no known allergies.    Review of Systems   Review of Systems  Skin:  Positive for wound (right foot).  Neurological:  Negative for numbness.    Physical Exam Updated Vital Signs BP 139/63   Pulse 64   Temp (!) 97.4 F (36.3 C)   Resp 16   SpO2 97%  Physical Exam Vitals and nursing note reviewed.  Constitutional:      General: She is not in acute distress.    Appearance: Normal appearance. She is not ill-appearing or diaphoretic.  Cardiovascular:     Rate and Rhythm: Normal rate and regular rhythm.     Pulses:          Dorsalis pedis pulses are 2+ on the right side.  Pulmonary:     Effort: Pulmonary effort is normal.  Musculoskeletal:     Left foot: Normal range of motion and normal capillary refill. Tenderness  present. No swelling, laceration or bony tenderness. Normal pulse.  Neurological:     Mental Status: She is alert. Mental status is at baseline.  Psychiatric:        Mood and Affect: Mood normal.        Behavior: Behavior normal.     ED Results / Procedures / Treatments   Labs (all labs ordered are listed, but only abnormal results are displayed) Labs Reviewed - No data to display  EKG None  Radiology DG Foot Complete Right  Result Date: 01/09/2023 CLINICAL DATA:  Stepped on sewing needle EXAM: RIGHT FOOT COMPLETE - 3 VIEW COMPARISON:  None Available. FINDINGS: 13 mm approximate linear radiopaque needle foreign body in the plantar soft tissues overlying the proximal phalanx of the second digit. No additional radiopaque foreign body. Otherwise hallux valgus deformity of the first ray  with mild hypertrophic degenerative changes. Degenerative changes are seen of several distal interphalangeal joints as well with some sclerosis particularly of the second, third and fourth digits. Well corticated Achilles greater than plantar calcaneal spur. No fracture or dislocation. IMPRESSION: Approximate 13 mm lung linear radiopaque foreign body in the plantar soft tissues overlying the proximal phalanx of second digit. Please correlate for location of injury. No fracture or dislocation. Hallux valgus deformity of the first ray. Electronically Signed   By: Karen Kays M.D.   On: 01/09/2023 17:54    Procedures .Foreign Body Removal  Date/Time: 01/09/2023 7:01 PM  Performed by: Lenard Simmer, PA-C Authorized by: Lenard Simmer, PA-C  Consent: Verbal consent obtained. Risks and benefits: risks, benefits and alternatives were discussed Consent given by: patient Patient understanding: patient states understanding of the procedure being performed Imaging studies: imaging studies available Patient identity confirmed: verbally with patient and arm band Body area: skin General location: lower extremity Location details: right foot Anesthesia: local infiltration  Anesthesia: Local Anesthetic: lidocaine 1% with epinephrine Anesthetic total: 2 mL  Sedation: Patient sedated: no  Patient cooperative: yes Localization method: visualized Removal mechanism: hemostat Dressing: antibiotic ointment and dressing applied Tendon involvement: none Depth: subcutaneous Complexity: simple 1 objects recovered. Objects recovered: broken sewing needle Post-procedure assessment: foreign body removed Patient tolerance: patient tolerated the procedure well with no immediate complications      Medications Ordered in ED Medications  Tdap (BOOSTRIX) injection 0.5 mL (0.5 mLs Intramuscular Given 01/09/23 1815)  lidocaine (PF) (XYLOCAINE) 1 % injection 5 mL (5 mLs Infiltration Given by Other 01/09/23  1902)    ED Course/ Medical Decision Making/ A&P                             Medical Decision Making Amount and/or Complexity of Data Reviewed Radiology: ordered.  Risk Prescription drug management.   This patient presents to the ED with chief complaint(s) of needle in right foot. The complaint involves an extensive differential diagnosis and also carries with it a high risk of complications and morbidity.    The differential diagnosis includes retained foreign body, acute bony injury, soft tissue injury   Initial Assessment:   Exam significant for pinpoint wound to the plantar surface of the right foot near the ball of the foot.  Patient has tenderness to palpation of this area.  No active bleeding or drainage.  No surrounding swelling, erythema, ecchymosis, or increased warmth.  DP pulse 2+.  Sensation intact.  Cap refill less than 2 seconds.  Treatment and Reassessment: Foreign body was successfully removed from  the foot.  Small incision was made and needle was extracted.  Please see procedure section for more detail.  See attached photo.  Disposition:   Will send patient home with short course of pain medicine and 5 days of antibiotics to prevent infection.  Patient's tetanus shot was updated while in the ED today.  Discussed postprocedure care and need to return for suture removal in 7 days.   The patient has been appropriately medically screened and/or stabilized in the ED. I have low suspicion for any other emergent medical condition which would require further screening, evaluation or treatment in the ED or require inpatient management. At time of discharge the patient is hemodynamically stable and in no acute distress. I have discussed work-up results and diagnosis with patient and answered all questions. Patient is agreeable with discharge plan. We discussed strict return precautions for returning to the emergency department and they verbalized understanding.             Final Clinical Impression(s) / ED Diagnoses Final diagnoses:  Foreign body in right foot, initial encounter    Rx / DC Orders ED Discharge Orders          Ordered    cephALEXin (KEFLEX) 500 MG capsule  4 times daily        01/09/23 1857    HYDROcodone-acetaminophen (NORCO/VICODIN) 5-325 MG tablet  Every 6 hours PRN        01/09/23 1857              Lenard Simmer, PA-C 01/09/23 1906    Maia Plan, MD 01/12/23 [phone]

## 2023-03-07 ENCOUNTER — Encounter: Payer: Self-pay | Admitting: Internal Medicine

## 2023-04-18 ENCOUNTER — Ambulatory Visit (AMBULATORY_SURGERY_CENTER): Payer: Medicare Other

## 2023-04-18 ENCOUNTER — Encounter: Payer: Self-pay | Admitting: Internal Medicine

## 2023-04-18 VITALS — Ht 64.0 in | Wt 165.0 lb

## 2023-04-18 DIAGNOSIS — Z1211 Encounter for screening for malignant neoplasm of colon: Secondary | ICD-10-CM

## 2023-04-18 MED ORDER — NA SULFATE-K SULFATE-MG SULF 17.5-3.13-1.6 GM/177ML PO SOLN: 1.0000 | Freq: Once | ORAL | 0 refills | Status: AC

## 2023-04-18 NOTE — Progress Notes (Signed)
Pre visit completed via phone call; Patient verified name, DOB, and address; No egg or soy allergy known to patient;  No issues known to pt with past sedation with any surgeries or procedures; Patient denies ever being told they had issues or difficulty with intubation;  No FH of Malignant Hyperthermia; Pt is not on diet pills; Pt is not on home 02;  Pt is not on blood thinners;  Pt reports issues with constipation -patient reports she does not drink enough water- advised patient to increase oral fluids, fruits/veggies that are allowed prior to prep, and increase activity as tolerated; patient advised she can also take OTC PRN stool softener or laxative;  No A fib or A flutter; Have any cardiac testing pending--NO Insurance verified during PV appt---[employer] Medicare Pt can ambulate without assistance;  Pt denies use of chewing tobacco; Discussed diabetic/weight loss medication holds; Discussed NSAID holds; Checked BMI to be less than 50; Pt instructed to use [employer] or GoodRx for a price reduction on prep;  Patient's chart reviewed by Cathlyn Parsons CNRA prior to previsit and patient appropriate for the [facility].  Pre visit completed and red dot placed by patient's name on their procedure day (on provider's schedule).    Instructions printed and mailed to the patient per her request;

## 2023-05-01 ENCOUNTER — Encounter: Payer: Self-pay | Admitting: Certified Registered Nurse Anesthetist

## 2023-05-02 ENCOUNTER — Encounter: Payer: Self-pay | Admitting: Internal Medicine

## 2023-05-02 ENCOUNTER — Ambulatory Visit (AMBULATORY_SURGERY_CENTER): Payer: Medicare Other | Admitting: Internal Medicine

## 2023-05-02 VITALS — BP 109/73 | HR 59 | Temp 97.5°F | Resp 11 | Ht 64.0 in | Wt 165.0 lb

## 2023-05-02 DIAGNOSIS — D122 Benign neoplasm of ascending colon: Secondary | ICD-10-CM

## 2023-05-02 DIAGNOSIS — Z1211 Encounter for screening for malignant neoplasm of colon: Secondary | ICD-10-CM | POA: Diagnosis not present

## 2023-05-02 DIAGNOSIS — D123 Benign neoplasm of transverse colon: Secondary | ICD-10-CM

## 2023-05-02 DIAGNOSIS — D120 Benign neoplasm of cecum: Secondary | ICD-10-CM

## 2023-05-02 DIAGNOSIS — K635 Polyp of colon: Secondary | ICD-10-CM | POA: Diagnosis not present

## 2023-05-02 MED ORDER — SODIUM CHLORIDE 0.9 % IV SOLN
500.0000 mL | INTRAVENOUS | Status: DC
Start: 2023-05-02 — End: 2023-05-02

## 2023-05-02 NOTE — Patient Instructions (Signed)
Await pathology results.  Handouts on polyps, diverticulosis, and hemorrhoids provided.  YOU HAD AN ENDOSCOPIC PROCEDURE TODAY AT [facility]:   Refer to the procedure report that was given to you for any specific questions about what was found during the examination.  If the procedure report does not answer your questions, please call your gastroenterologist to clarify.  If you requested that your care partner not be given the details of your procedure findings, then the procedure report has been included in a sealed envelope for you to review at your convenience later.  YOU SHOULD EXPECT: Some feelings of bloating in the abdomen. Passage of more gas than usual.  Walking can help get rid of the air that was put into your GI tract during the procedure and reduce the bloating. If you had a lower endoscopy (such as a colonoscopy or flexible sigmoidoscopy) you may notice spotting of blood in your stool or on the toilet paper. If you underwent a bowel prep for your procedure, you may not have a normal bowel movement for a few days.  Please Note:  You might notice some irritation and congestion in your nose or some drainage.  This is from the oxygen used during your procedure.  There is no need for concern and it should clear up in a day or so.  SYMPTOMS TO REPORT IMMEDIATELY:  Following lower endoscopy (colonoscopy or flexible sigmoidoscopy):  Excessive amounts of blood in the stool  Significant tenderness or worsening of abdominal pains  Swelling of the abdomen that is new, acute  Fever of 100F or higher   For urgent or emergent issues, a gastroenterologist can be reached at any hour by calling [phone]. Do not use MyChart messaging for urgent concerns.    DIET:  We do recommend a small meal at first, but then you may proceed to your regular diet.  Drink plenty of fluids but you should avoid alcoholic beverages for 24 hours.  ACTIVITY:  You should plan to take it easy  for the rest of today and you should NOT DRIVE or use heavy machinery until tomorrow (because of the sedation medicines used during the test).    FOLLOW UP: Our staff will call the number listed on your records the next business day following your procedure.  We will call around 7:15- 8:00 am to check on you and address any questions or concerns that you may have regarding the information given to you following your procedure. If we do not reach you, we will leave a message.     If any biopsies were taken you will be contacted by phone or by letter within the next 1-3 weeks.  Please call us at [phone] if you have not heard about the biopsies in 3 weeks.    SIGNATURES/CONFIDENTIALITY: You and/or your care partner have signed paperwork which will be entered into your electronic medical record.  These signatures attest to the fact that that the information above on your After Visit Summary has been reviewed and is understood.  Full responsibility of the confidentiality of this discharge information lies with you and/or your care-partner.

## 2023-05-02 NOTE — Progress Notes (Signed)
Pt's states no medical or surgical changes since previsit or office visit. 

## 2023-05-02 NOTE — Progress Notes (Signed)
GASTROENTEROLOGY PROCEDURE H&P NOTE   Primary Care Physician: Garlan Fillers, MD    Reason for Procedure:   Colon cancer screening  Plan:    Colonoscopy  Patient is appropriate for endoscopic procedure(s) in the ambulatory ([facility]) setting.  The nature of the procedure, as well as the risks, benefits, and alternatives were carefully and thoroughly reviewed with the patient. Ample time for discussion and questions allowed. The patient understood, was satisfied, and agreed to proceed.     HPI: Natalie Little is a 67 y.o. female who presents for colonoscopy for colon cancer screening. Last colonoscopy 10 years ago was normal with mild sigmoid diverticulosis. Denies blood in stools, changes in bowel habits, or unintentional weight loss. Denies family history of colon cancer.  Past Medical History:  Diagnosis Date   Anxiety    on meds   Cataract 12/2022   bilateral sx   Depression    bi polar-on meds    Past Surgical History:  Procedure Laterality Date   CATARACT EXTRACTION, BILATERAL  12/2022   TOTAL ABDOMINAL HYSTERECTOMY      Prior to Admission medications   Medication Sig Start Date End Date Taking? Authorizing Provider  BIOTIN PO Take 1 tablet by mouth daily.    [provider]  buPROPion (WELLBUTRIN XL) 150 MG 24 hr tablet Take 150 mg by mouth every morning. Patient not taking: Reported on 04/18/2023 12/03/22   [provider]  busPIRone (BUSPAR) 30 MG tablet Take 30 mg by mouth. Takes 20 mg in the morning , 20 mg at noon and 10 mg at night (tablet is broken in to 3rds)    [provider]  butalbital-aspirin-caffeine (FIORINAL) 50-325-40 MG per capsule Take 2 capsules by mouth as needed for migraine.    [provider]  CALCIUM-VITAMIN D PO Take 1 tablet by mouth daily at 6 (six) AM.    [provider]  clonazepam (KLONOPIN) 0.125 MG disintegrating tablet Take 0.125 mg by mouth every morning. 01/21/23   [provider]  clonazePAM (KLONOPIN) 0.5 MG tablet Take 0.5 mg by mouth at bedtime. 02/01/23   [provider]  divalproex (DEPAKOTE ER) 250 MG 24 hr tablet Take 750 mg by mouth at bedtime. 03/01/23   [provider]  estradiol (CLIMARA - DOSED IN MG/24 HR) 0.05 mg/24hr patch Place 1 patch onto the skin 2 (two) times a week. Patch cut in half    [provider]  gabapentin (NEURONTIN) 100 MG capsule Take 100 mg by mouth in the morning and at bedtime. Takes 200mg  in the morning and 300mg  at bedtime    [provider]  lamoTRIgine (LAMICTAL) 25 MG tablet Take 25 mg by mouth 2 (two) times daily. 175 mg in the morning and 75 mg in the evening    [provider]  MELATONIN PO Take 20 mg by mouth at bedtime.    [provider]  Multiple Vitamin (MULTIVITAMIN) capsule Take 1 capsule by mouth daily.    [provider]  sodium chloride 1 g tablet Take 1 g by mouth daily. 02/01/23   [provider]  solifenacin (VESICARE) 5 MG tablet Take 5 mg by mouth daily.    [provider]    Current Outpatient Medications  Medication Sig Dispense Refill   BIOTIN PO Take 1 tablet by mouth daily.     buPROPion (WELLBUTRIN XL) 150 MG 24 hr tablet Take 150 mg by mouth every morning. (Patient not taking: Reported  on 04/18/2023)     busPIRone (BUSPAR) 30 MG tablet Take 30 mg by mouth. Takes 20 mg in the morning , 20 mg at noon and 10 mg at night (tablet is broken in to 3rds)     butalbital-aspirin-caffeine (FIORINAL) 50-325-40 MG per capsule Take 2 capsules by mouth as needed for migraine.     CALCIUM-VITAMIN D PO Take 1 tablet by mouth daily at 6 (six) AM.     clonazepam (KLONOPIN) 0.125 MG disintegrating tablet Take 0.125 mg by mouth every morning.     clonazePAM (KLONOPIN) 0.5 MG tablet Take 0.5 mg by mouth at bedtime.     divalproex (DEPAKOTE ER) 250 MG 24 hr tablet Take 750 mg by mouth at bedtime.     estradiol (CLIMARA - DOSED IN MG/24 HR) 0.05 mg/24hr  patch Place 1 patch onto the skin 2 (two) times a week. Patch cut in half     gabapentin (NEURONTIN) 100 MG capsule Take 100 mg by mouth in the morning and at bedtime. Takes 200mg  in the morning and 300mg  at bedtime     lamoTRIgine (LAMICTAL) 25 MG tablet Take 25 mg by mouth 2 (two) times daily. 175 mg in the morning and 75 mg in the evening     MELATONIN PO Take 20 mg by mouth at bedtime.     Multiple Vitamin (MULTIVITAMIN) capsule Take 1 capsule by mouth daily.     sodium chloride 1 g tablet Take 1 g by mouth daily.     solifenacin (VESICARE) 5 MG tablet Take 5 mg by mouth daily.     Current Facility-Administered Medications  Medication Dose Route Frequency Provider Last Rate Last Admin   0.9 %  sodium chloride infusion  500 mL Intravenous Continuous Imogene Burn, MD        Allergies as of 05/02/2023   (No Known Allergies)    Family History  Problem Relation Age of Onset   Colon polyps Neg Hx    Colon cancer Neg Hx    Esophageal cancer Neg Hx    Rectal cancer Neg Hx    Stomach cancer Neg Hx     Social History   Socioeconomic History   Marital status: Married    Spouse name: Not on file   Number of children: Not on file   Years of education: Not on file   Highest education level: Not on file  Occupational History   Not on file  Tobacco Use   Smoking status: Never   Smokeless tobacco: Never  Vaping Use   Vaping status: Never Used  Substance and Sexual Activity   Alcohol use: No   Drug use: No   Sexual activity: Not on file  Other Topics Concern   Not on file  Social History Narrative   Not on file   Social Determinants of Health   Financial Resource Strain: Not on file  Food Insecurity: Not on file  Transportation Needs: Not on file  Physical Activity: Not on file  Stress: Not on file  Social Connections: Not on file  Intimate Partner Violence: Not on file    Physical Exam: Vital signs in last 24 hours: BP (!) 143/72   Pulse (!) 56   Temp (!) 97.5 F  (36.4 C) (Temporal)   Ht 5\' 4"  (1.626 m)   Wt 165 lb (74.8 kg)   SpO2 98%   BMI 28.32 kg/m  GEN: NAD EYE: Sclerae anicteric ENT: MMM CV: Non-tachycardic Pulm: No increased work of  breathing GI: Soft, NT/ND NEURO:  Alert & Oriented   Eulah Pont, MD  Gastroenterology  05/02/2023 9:00 AM

## 2023-05-02 NOTE — Op Note (Signed)
[facility] Patient Name: Natalie Little Procedure Date: 05/02/2023 9:13 AM MRN: [id number] Endoscopist: Natalie Little , , [id number] Age: 67 Referring MD:  Date of Birth: May 21, 1956 Gender: Female Account #: [id number] Procedure:                Colonoscopy Indications:              Screening for colorectal malignant neoplasm Medicines:                Monitored Anesthesia Care Procedure:                Pre-Anesthesia Assessment:                           - Prior to the procedure, a History and Physical                            was performed, and patient medications and                            allergies were reviewed. The patient's tolerance of                            previous anesthesia was also reviewed. The risks                            and benefits of the procedure and the sedation                            options and risks were discussed with the patient.                            All questions were answered, and informed consent                            was obtained. Prior Anticoagulants: The patient has                            taken no anticoagulant or antiplatelet agents. ASA                            Grade Assessment: II - A patient with mild systemic                            disease. After reviewing the risks and benefits,                            the patient was deemed in satisfactory condition to                            undergo the procedure.                           After obtaining informed consent, the colonoscope  was passed under direct vision. Throughout the                            procedure, the patient's blood pressure, pulse, and                            oxygen saturations were monitored continuously. The                            CF [id number] #[id number] was introduced through the anus                            and advanced to the the cecum, identified by                            appendiceal  orifice and ileocecal valve. The                            colonoscopy was performed without difficulty. The                            patient tolerated the procedure well. The quality                            of the bowel preparation was good. The ileocecal                            valve, appendiceal orifice, and rectum were                            photographed. Scope In: 9:15:17 AM Scope Out: 9:44:54 AM Scope Withdrawal Time: 0 hours 19 minutes 37 seconds  Total Procedure Duration: 0 hours 29 minutes 37 seconds  Findings:                 Four sessile polyps were found in the transverse                            colon, ascending colon and cecum. The polyps were 3                            to 10 mm in size. These polyps were removed with a                            cold snare. Resection and retrieval were complete.                           Multiple diverticula were found in the sigmoid                            colon.                           Non-bleeding internal hemorrhoids were found during  retroflexion. Complications:            No immediate complications. Estimated Blood Loss:     Estimated blood loss was minimal. Impression:               - Four 3 to 10 mm polyps in the transverse colon,                            in the ascending colon and in the cecum, removed                            with a cold snare. Resected and retrieved.                           - Diverticulosis in the sigmoid colon.                           - Non-bleeding internal hemorrhoids. Recommendation:           - Discharge patient to home (with escort).                           - Await pathology results.                           - The findings and recommendations were discussed                            with the patient. Dr Natalie Little "Natalie Ripper" Leonides Little,  05/02/2023 9:49:11 AM

## 2023-05-03 ENCOUNTER — Telehealth: Payer: Self-pay

## 2023-05-03 NOTE — Telephone Encounter (Signed)
  Follow up Call-     05/02/2023    8:58 AM  Call back number  Post procedure Call Back phone  # [phone]  Permission to leave phone message Yes     Patient questions:  Do you have a fever, pain , or abdominal swelling? No. Pain Score  0 *  Have you tolerated food without any problems? Yes.    Have you been able to return to your normal activities? Yes.    Do you have any questions about your discharge instructions: Diet   No. Medications  No. Follow up visit  No.  Do you have questions or concerns about your Care? No.  Actions: * If pain score is 4 or above: No action needed, pain <4.

## 2023-05-06 LAB — SURGICAL PATHOLOGY

## 2023-05-07 ENCOUNTER — Encounter: Payer: Self-pay | Admitting: Internal Medicine

## 2024-01-08 ENCOUNTER — Ambulatory Visit: Admitting: Podiatry

## 2024-01-08 ENCOUNTER — Encounter: Payer: Self-pay | Admitting: Podiatry

## 2024-01-08 ENCOUNTER — Ambulatory Visit (INDEPENDENT_AMBULATORY_CARE_PROVIDER_SITE_OTHER)

## 2024-01-08 VITALS — Ht 64.0 in | Wt 165.0 lb

## 2024-01-08 DIAGNOSIS — M7751 Other enthesopathy of right foot: Secondary | ICD-10-CM | POA: Diagnosis not present

## 2024-01-08 DIAGNOSIS — D3613 Benign neoplasm of peripheral nerves and autonomic nervous system of lower limb, including hip: Secondary | ICD-10-CM | POA: Diagnosis not present

## 2024-01-08 DIAGNOSIS — M21619 Bunion of unspecified foot: Secondary | ICD-10-CM

## 2024-01-08 MED ORDER — TRIAMCINOLONE ACETONIDE 10 MG/ML IJ SUSP
10.0000 mg | Freq: Once | INTRAMUSCULAR | Status: AC
Start: 2024-01-08 — End: 2024-01-08
  Administered 2024-01-08: 10 mg via INTRA_ARTICULAR

## 2024-01-09 NOTE — Progress Notes (Signed)
 Subjective:   Patient ID: Natalie Little, female   DOB: 68 y.o.   MRN: [id number]   HPI Patient presents with a lot of pain around the second metatarsal phalangeal joint right of several months duration and does not remember specifically if there was an injury associated with this.  It has gotten worse over that time the patient does not smoke likes to be active   Review of Systems  All other systems reviewed and are negative.       Objective:  Physical Exam Vitals and nursing note reviewed.  Constitutional:      Appearance: She is well-developed.  Pulmonary:     Effort: Pulmonary effort is normal.   Musculoskeletal:        General: Normal range of motion.   Skin:    General: Skin is warm.   Neurological:     Mental Status: She is alert.     Neurovascular status intact muscle strength adequate range of motion within normal limits with exquisite discomfort second metatarsal phalangeal joint with fluid around the joint and reduced range of motion with structural bunion deformity also noted.  Good digital perfusion well-oriented x 3     Assessment:   inflammatory capsulitis of the second MPJ right with structural deformity     Plan:  H&P condition reviewed I did a forefoot block 60 mg after Marcaine mixture today I aspirated the second MPJ canal a small amount of fluid and then injected with quarter cc dexamethasone Kenalog  apply thick plantar padding to take pressure off the joint surface advised on rigid bottom shoes reappoint to recheck  X-rays indicate no signs of fracture no signs of arthritis structural bunion deformity noted

## 2024-02-29 ENCOUNTER — Encounter (HOSPITAL_COMMUNITY): Payer: Self-pay

## 2024-02-29 ENCOUNTER — Ambulatory Visit (HOSPITAL_COMMUNITY)
Admission: EM | Admit: 2024-02-29 | Discharge: 2024-02-29 | Disposition: A | Discharge status: Home / Self Care | Attending: Emergency Medicine | Admitting: Emergency Medicine

## 2024-02-29 DIAGNOSIS — G8929 Other chronic pain: Secondary | ICD-10-CM | POA: Diagnosis not present

## 2024-02-29 DIAGNOSIS — M5442 Lumbago with sciatica, left side: Secondary | ICD-10-CM | POA: Diagnosis not present

## 2024-02-29 MED ORDER — DEXAMETHASONE SODIUM PHOSPHATE 10 MG/ML IJ SOLN
INTRAMUSCULAR | Status: AC
  Filled 2024-02-29: qty 1

## 2024-02-29 MED ORDER — PREDNISONE 20 MG PO TABS: 40.0000 mg | ORAL_TABLET | Freq: Every day | ORAL | 0 refills | Status: AC

## 2024-02-29 MED ORDER — DEXAMETHASONE SODIUM PHOSPHATE 10 MG/ML IJ SOLN
10.0000 mg | Freq: Once | INTRAMUSCULAR | Status: AC
  Administered 2024-02-29: 10 mg via INTRAMUSCULAR

## 2024-02-29 NOTE — ED Provider Notes (Signed)
 [facility]    CSN: [id number] Arrival date & time: 02/29/24  1143      History   Chief Complaint Chief Complaint  Patient presents with   Back Pain    HPI Natalie Little is a 68 y.o. female.   Patient presents with chronic left sided back pain with left-sided sciatica that has been going on for years.  Patient states that her pain began again about a week ago and reports that she can normally go to the chiropractor and they can relieve this, however this time they were unsuccessful.  Patient states that she has been taking Aleve  with some relief, but the pain continues.  Patient reports that the pain is worse with movement and walking.  Patient states that she has never seen orthopedic doctor for her pain.  The history is provided by the patient and medical records.  Back Pain   Past Medical History:  Diagnosis Date   Anxiety    on meds   Cataract 12/2022   bilateral sx   Depression    bi polar-on meds    There are no active problems to display for this patient.   Past Surgical History:  Procedure Laterality Date   CATARACT EXTRACTION, BILATERAL  12/2022   TOTAL ABDOMINAL HYSTERECTOMY      OB History   No obstetric history on file.      Home Medications    Prior to Admission medications   Medication Sig Start Date End Date Taking? Authorizing Provider  predniSONE  (DELTASONE ) 20 MG tablet Take 2 tablets (40 mg total) by mouth daily for 5 days. 02/29/24 03/05/24 Yes Johnie, Jaquil Todt A, NP  buPROPion (WELLBUTRIN XL) 150 MG 24 hr tablet Take 150 mg by mouth every morning. 12/03/22   [provider]  busPIRone (BUSPAR) 30 MG tablet Take 30 mg by mouth. Takes 20 mg in the morning , 20 mg at noon and 10 mg at night (tablet is broken in to 3rds)    [provider]  butalbital-aspirin-caffeine (FIORINAL) 50-325-40 MG per capsule Take 2 capsules by mouth as needed for migraine.    [provider]  CALCIUM-VITAMIN D PO Take 1 tablet by  mouth daily at 6 (six) AM.    [provider]  clonazepam (KLONOPIN) 0.125 MG disintegrating tablet Take 0.125 mg by mouth every morning. 01/21/23   [provider]  clonazePAM (KLONOPIN) 0.5 MG tablet Take 0.5 mg by mouth at bedtime. 02/01/23   [provider]  divalproex (DEPAKOTE ER) 250 MG 24 hr tablet Take 750 mg by mouth at bedtime. 03/01/23   [provider]  estradiol (CLIMARA - DOSED IN MG/24 HR) 0.05 mg/24hr patch Place 1 patch onto the skin 2 (two) times a week. Patch cut in half    [provider]  gabapentin (NEURONTIN) 100 MG capsule Take 100 mg by mouth in the morning and at bedtime. Takes 200mg  in the morning and 300mg  at bedtime    [provider]  lamoTRIgine (LAMICTAL) 25 MG tablet Take 25 mg by mouth 2 (two) times daily. 175 mg in the morning and 75 mg in the evening    [provider]  MELATONIN PO Take 20 mg by mouth at bedtime.    [provider]  Multiple Vitamin (MULTIVITAMIN) capsule Take 1 capsule by mouth daily.    [provider]  sodium chloride  1 g tablet Take 1 g by mouth daily. 02/01/23   [provider]  solifenacin (  VESICARE) 5 MG tablet Take 5 mg by mouth daily.    [provider]    Family History Family History  Problem Relation Age of Onset   Colon polyps Neg Hx    Colon cancer Neg Hx    Esophageal cancer Neg Hx    Rectal cancer Neg Hx    Stomach cancer Neg Hx     Social History Social History   Tobacco Use   Smoking status: Never   Smokeless tobacco: Never  Vaping Use   Vaping status: Never Used  Substance Use Topics   Alcohol use: No   Drug use: No     Allergies   Patient has no known allergies.   Review of Systems Review of Systems  Musculoskeletal:  Positive for back pain.   Per HPI  Physical Exam Triage Vital Signs ED Triage Vitals  Encounter Vitals Group     BP 02/29/24 1227 (!) 160/69     Girls Systolic BP Percentile --       Girls Diastolic BP Percentile --      Boys Systolic BP Percentile --      Boys Diastolic BP Percentile --      Pulse Rate 02/29/24 1227 71     Resp 02/29/24 1227 16     Temp 02/29/24 1227 98.2 F (36.8 C)     Temp Source 02/29/24 1227 Oral     SpO2 02/29/24 1227 95 %     Weight --      Height --      Head Circumference --      Peak Flow --      Pain Score 02/29/24 1228 9     Pain Loc --      Pain Education --      Exclude from Growth Chart --    No data found.  Updated Vital Signs BP (!) 160/69 (BP Location: Left Arm)   Pulse 71   Temp 98.2 F (36.8 C) (Oral)   Resp 16   SpO2 95%   Visual Acuity Right Eye Distance:   Left Eye Distance:   Bilateral Distance:    Right Eye Near:   Left Eye Near:    Bilateral Near:     Physical Exam Vitals and nursing note reviewed.  Constitutional:      General: She is awake. She is not in acute distress.    Appearance: Normal appearance. She is well-developed and well-groomed. She is not ill-appearing.  Musculoskeletal:     Cervical back: Normal.     Thoracic back: Normal.     Lumbar back: Tenderness present. Positive left straight leg raise test.       Back:  Skin:    General: Skin is warm and dry.  Neurological:     Mental Status: She is alert.  Psychiatric:        Behavior: Behavior is cooperative.      UC Treatments / Results  Labs (all labs ordered are listed, but only abnormal results are displayed) Labs Reviewed - No data to display  EKG   Radiology No results found.  Procedures Procedures (including critical care time)  Medications Ordered in UC Medications  dexamethasone  (DECADRON ) injection 10 mg (has no administration in time range)    Initial Impression / Assessment and Plan / UC Course  I have reviewed the triage vital signs and the nursing notes.  Pertinent labs & imaging results that were available during my care of the patient were reviewed by  me and considered in my medical decision making  (see chart for details).     Patient is overall well-appearing.  Vitals are stable.  Exam findings consistent with low back pain with sciatica.  Given IM Decadron  in clinic.  Prescribed prednisone  burst.  Given orthopedic follow-up.  Discussed follow-up and return precautions. Final Clinical Impressions(s) / UC Diagnoses   Final diagnoses:  Chronic left-sided low back pain with left-sided sciatica     Discharge Instructions      You are given an injection of Decadron  in clinic today which is a steroid to help relieve inflammation related to your pain. Tomorrow start taking 2 tablets of prednisone  once daily for 5 days for additional relief. You can continue to take Aleve  twice daily and alternate this with Tylenol  every 6-8 hours as needed for pain. Alternate between ice and heat and do some gentle stretching to avoid becoming stiff for additional relief of pain You can follow-up with [facility] for further evaluation management of your chronic back pain. Otherwise follow-up with primary care or return here as needed.   ED Prescriptions     Medication Sig Dispense Auth. Provider   predniSONE  (DELTASONE ) 20 MG tablet Take 2 tablets (40 mg total) by mouth daily for 5 days. 10 tablet Johnie Flaming A, NP      PDMP not reviewed this encounter.   Johnie Flaming A, NP 02/29/24 [phone]

## 2024-02-29 NOTE — ED Triage Notes (Signed)
 Patient here today with c/o left side low back pain that radiates down left leg X 1 week. Patient goes to a chiropractor and that usually helps but this time it hasn't helped.

## 2024-02-29 NOTE — Discharge Instructions (Signed)
 You are given an injection of Decadron  in clinic today which is a steroid to help relieve inflammation related to your pain. Tomorrow start taking 2 tablets of prednisone  once daily for 5 days for additional relief. You can continue to take Aleve  twice daily and alternate this with Tylenol  every 6-8 hours as needed for pain. Alternate between ice and heat and do some gentle stretching to avoid becoming stiff for additional relief of pain You can follow-up with [facility] for further evaluation management of your chronic back pain. Otherwise follow-up with primary care or return here as needed.

## 2024-03-03 ENCOUNTER — Emergency Department (HOSPITAL_COMMUNITY): Admission: EM | Admit: 2024-03-03 | Discharge: 2024-03-03 | Disposition: A | Discharge status: Home / Self Care

## 2024-03-03 ENCOUNTER — Encounter (HOSPITAL_COMMUNITY): Payer: Self-pay | Admitting: Emergency Medicine

## 2024-03-03 ENCOUNTER — Other Ambulatory Visit: Payer: Self-pay

## 2024-03-03 DIAGNOSIS — R55 Syncope and collapse: Secondary | ICD-10-CM | POA: Insufficient documentation

## 2024-03-03 LAB — BASIC METABOLIC PANEL WITH GFR
Anion gap: 10 (ref 5–15)
BUN: 17 mg/dL (ref 8–23)
CO2: 24 mmol/L (ref 22–32)
Calcium: 8.9 mg/dL (ref 8.9–10.3)
Chloride: 101 mmol/L (ref 98–111)
Creatinine, Ser: 0.7 mg/dL (ref 0.44–1.00)
GFR, Estimated: >60 mL/min (ref >60)
Glucose, Bld: 106 mg/dL — ABNORMAL HIGH (ref 70–99)
Potassium: 3.6 mmol/L (ref 3.5–5.1)
Sodium: 135 mmol/L (ref 135–145)

## 2024-03-03 LAB — CBC
HCT: 42.6 % (ref 36.0–46.0)
Hemoglobin: 13.7 g/dL (ref 12.0–15.0)
MCH: 29.2 pg (ref 26.0–34.0)
MCHC: 32.2 g/dL (ref 30.0–36.0)
MCV: 90.8 fL (ref 80.0–100.0)
Platelets: 325 K/uL (ref 150–400)
RBC: 4.69 MIL/uL (ref 3.87–5.11)
RDW: 13 % (ref 11.5–15.5)
WBC: 8.3 K/uL (ref 4.0–10.5)
nRBC: 0 % (ref 0.0–0.2)

## 2024-03-03 LAB — TROPONIN I (HIGH SENSITIVITY)
Troponin I (High Sensitivity): 5 ng/L (ref <18)
Troponin I (High Sensitivity): 5 ng/L (ref <18)

## 2024-03-03 LAB — D-DIMER, QUANTITATIVE: D-Dimer, Quant: <0.27 ug{FEU}/mL (ref 0.00–0.50)

## 2024-03-03 LAB — CBG MONITORING, ED: Glucose-Capillary: 111 mg/dL — ABNORMAL HIGH (ref 70–99)

## 2024-03-03 MED ORDER — KETOROLAC TROMETHAMINE 30 MG/ML IJ SOLN
INTRAMUSCULAR | Status: AC
  Administered 2024-03-03: 30 mg
  Filled 2024-03-03: qty 1

## 2024-03-03 MED ORDER — KETOROLAC TROMETHAMINE 15 MG/ML IJ SOLN: 15.0000 mg | Freq: Once | INTRAMUSCULAR | Status: DC

## 2024-03-03 MED ORDER — METHOCARBAMOL 500 MG PO TABS
750.0000 mg | ORAL_TABLET | Freq: Once | ORAL | Status: AC
  Administered 2024-03-03: 750 mg via ORAL
  Filled 2024-03-03: qty 2

## 2024-03-03 MED ORDER — LACTATED RINGERS IV BOLUS
1000.0000 mL | Freq: Once | INTRAVENOUS | Status: AC
  Administered 2024-03-03: 1000 mL via INTRAVENOUS

## 2024-03-03 MED ORDER — METHOCARBAMOL 500 MG PO TABS: 500.0000 mg | ORAL_TABLET | Freq: Two times a day (BID) | ORAL | 0 refills | Status: AC

## 2024-03-03 MED ORDER — NAPROXEN 500 MG PO TABS: 500.0000 mg | ORAL_TABLET | Freq: Two times a day (BID) | ORAL | 0 refills | Status: AC

## 2024-03-03 NOTE — ED Provider Notes (Signed)
 [place] EMERGENCY DEPARTMENT AT [facility] Provider Note   CSN: [id number] Arrival date & time: 03/03/24  9048     Patient presents with: Loss of Consciousness   Natalie Little is a 68 y.o. female.   68 year old female with past medical history of depression and anxiety with recent treatment of lumbar strain with IM Decadron , steroids, and Percocet presenting to the emergency department today after a syncopal episode.  The patient states that she took Percocet at 3 AM.  States she woke up a few hours later and when she got up out of bed and walked to the kitchen that she started to feel lightheaded.  She apparently sat on a stool and slumped over.  Her husband helped get her to the ground and she did not injure herself with this.  She denies any associated chest pain or shortness of breath.  Reports she is feeling better after fluids with medics.  She states that she did have some diaphoresis during this episode earlier.  Denied any associated chest pain.  She came to the ER for further evaluation.  She denies any blood in her stool or dark stools.   Loss of Consciousness      Prior to Admission medications   Medication Sig Start Date End Date Taking? Authorizing Provider  methocarbamol  (ROBAXIN ) 500 MG tablet Take 1 tablet (500 mg total) by mouth 2 (two) times daily. 03/03/24  Yes Ula Prentice SAUNDERS, MD  naproxen  (NAPROSYN ) 500 MG tablet Take 1 tablet (500 mg total) by mouth 2 (two) times daily. 03/03/24  Yes Ula Prentice SAUNDERS, MD  buPROPion (WELLBUTRIN XL) 150 MG 24 hr tablet Take 150 mg by mouth every morning. 12/03/22   [provider]  busPIRone (BUSPAR) 30 MG tablet Take 30 mg by mouth. Takes 20 mg in the morning , 20 mg at noon and 10 mg at night (tablet is broken in to 3rds)    [provider]  butalbital-aspirin-caffeine (FIORINAL) 50-325-40 MG per capsule Take 2 capsules by mouth as needed for migraine.    [provider]  CALCIUM-VITAMIN D PO Take 1  tablet by mouth daily at 6 (six) AM.    [provider]  clonazepam (KLONOPIN) 0.125 MG disintegrating tablet Take 0.125 mg by mouth every morning. 01/21/23   [provider]  clonazePAM (KLONOPIN) 0.5 MG tablet Take 0.5 mg by mouth at bedtime. 02/01/23   [provider]  divalproex (DEPAKOTE ER) 250 MG 24 hr tablet Take 750 mg by mouth at bedtime. 03/01/23   [provider]  estradiol (CLIMARA - DOSED IN MG/24 HR) 0.05 mg/24hr patch Place 1 patch onto the skin 2 (two) times a week. Patch cut in half    [provider]  gabapentin (NEURONTIN) 100 MG capsule Take 100 mg by mouth in the morning and at bedtime. Takes 200mg  in the morning and 300mg  at bedtime    [provider]  lamoTRIgine (LAMICTAL) 25 MG tablet Take 25 mg by mouth 2 (two) times daily. 175 mg in the morning and 75 mg in the evening    [provider]  MELATONIN PO Take 20 mg by mouth at bedtime.    [provider]  Multiple Vitamin (MULTIVITAMIN) capsule Take 1 capsule by mouth daily.    [provider]  predniSONE  (DELTASONE ) 20 MG tablet Take 2 tablets (40 mg total) by mouth daily for 5 days. 02/29/24 03/05/24  Johnie Flaming A, NP  sodium chloride  1 g  tablet Take 1 g by mouth daily. 02/01/23   [provider]  solifenacin (VESICARE) 5 MG tablet Take 5 mg by mouth daily.    [provider]    Allergies: Patient has no known allergies.    Review of Systems  Cardiovascular:  Positive for syncope.  All other systems reviewed and are negative.   Updated Vital Signs BP (!) 119/50   Pulse 75   Temp 98.4 F (36.9 C) (Oral)   Resp (!) 21   Ht 5' 4 (1.626 m)   Wt 74.8 kg   SpO2 97%   BMI 28.32 kg/m   Physical Exam Vitals and nursing note reviewed.   Gen: NAD Eyes: PERRL, EOMI HEENT: no oropharyngeal swelling Neck: trachea midline Resp: clear to auscultation bilaterally Card: RRR, no murmurs, rubs, or gallops Abd: nontender,  nondistended Extremities: no calf tenderness, no edema Vascular: 2+ radial pulses bilaterally, 2+ DP pulses bilaterally Neuro: No focal deficits Skin: no rashes Psyc: acting appropriately   (all labs ordered are listed, but only abnormal results are displayed) Labs Reviewed  BASIC METABOLIC PANEL WITH GFR - Abnormal; Notable for the following components:      Result Value   Glucose, Bld 106 (*)    All other components within normal limits  CBG MONITORING, ED - Abnormal; Notable for the following components:   Glucose-Capillary 111 (*)    All other components within normal limits  CBC  D-DIMER, QUANTITATIVE  TROPONIN I (HIGH SENSITIVITY)  TROPONIN I (HIGH SENSITIVITY)    EKG: EKG Interpretation Date/Time:  Tuesday March 03 2024 10:11:12 EDT Ventricular Rate:  68 PR Interval:  158 QRS Duration:  111 QT Interval:  422 QTC Calculation: 449 R Axis:   24  Text Interpretation: Sinus rhythm RSR' in V1 or V2, right VCD or RVH Nonspecific T abnormalities, inferior leads Confirmed by Ula Barter [phone]) on 03/03/2024 10:19:59 AM  Radiology: No results found.   Procedures   Medications Ordered in the ED  lactated ringers  bolus 1,000 mL (0 mLs Intravenous Stopped 03/03/24 1137)  methocarbamol  (ROBAXIN ) tablet 750 mg (750 mg Oral Given 03/03/24 1017)                                    Medical Decision Making 68 year old female with past medical history of depression and anxiety presenting to the emergency department today after a syncopal episode.  I will further evaluate the patient here with basic labs as well as an EKG and troponin to eval for ACS, anemia, electrolyte abnormalities, or conduction disturbances.  Will keep the patient on the cardiac monitor.  Given the diaphoresis will also obtain a troponin to evaluate for atypical ACS.  The patient is prescribed estrogen supplementation and does have some mild inferior abnormalities on her EKG.  Will obtain D-dimer to evaluate for  pulmonary embolism.  Will give patient IV fluids.  This could be due to her new medications that he was recently started.  I will reevaluate for ultimate disposition.  The patient's work appears reassuring.  She remained stable here on the monitor.  She is ambulatory without any further symptoms.  Given her age we did discuss admission versus going home with close outpatient follow-up as I suspect this is likely medication related.  The patient ultimately would like to go home through shared decision making.  She is discharged with return precautions.  Amount and/or Complexity of Data Reviewed  Labs: ordered.  Risk Prescription drug management.        Final diagnoses:  Syncope, unspecified syncope type    ED Discharge Orders          Ordered    methocarbamol  (ROBAXIN ) 500 MG tablet  2 times daily        03/03/24 1511    naproxen  (NAPROSYN ) 500 MG tablet  2 times daily        03/03/24 1511               Ula Prentice SAUNDERS, MD 03/03/24 1512

## 2024-03-03 NOTE — ED Triage Notes (Signed)
 Per [facility] pt coming from home- walking into kitchen this morning and felt dizzy so she sat down then had a syncopal episode. Did not fall. Started on oxycodone yesterday for back pain. Initial BP 60 palp. Given 500 CC NS en route.

## 2024-03-03 NOTE — Discharge Instructions (Signed)
 Your workup today was reassuring.  Please stop the oxycodone and start the naproxen  twice daily as well as the Robaxin .  Please stop the prednisone  as well.  Please return to the emergency department for worsening symptoms.

## 2024-03-03 NOTE — ED Notes (Signed)
Pt ambulatory to bathroom with a steady gait

## 2024-03-09 ENCOUNTER — Other Ambulatory Visit (HOSPITAL_COMMUNITY): Payer: Self-pay | Admitting: Family Medicine

## 2024-03-09 DIAGNOSIS — R109 Unspecified abdominal pain: Secondary | ICD-10-CM

## 2024-03-10 ENCOUNTER — Ambulatory Visit (HOSPITAL_COMMUNITY)
Admission: RE | Admit: 2024-03-10 | Discharge: 2024-03-10 | Disposition: A | Discharge status: Home / Self Care | Source: Ambulatory Visit | Attending: Family Medicine | Admitting: Family Medicine

## 2024-03-10 DIAGNOSIS — R109 Unspecified abdominal pain: Secondary | ICD-10-CM | POA: Insufficient documentation

## 2024-03-10 MED ORDER — IOHEXOL 300 MG/ML  SOLN
100.0000 mL | Freq: Once | INTRAMUSCULAR | Status: AC | PRN
  Administered 2024-03-10 (×2): 100 mL via INTRAVENOUS

## 2024-04-27 ENCOUNTER — Encounter (INDEPENDENT_AMBULATORY_CARE_PROVIDER_SITE_OTHER): Payer: Self-pay

## 2024-05-14 ENCOUNTER — Ambulatory Visit: Admitting: Podiatry

## 2024-05-14 ENCOUNTER — Ambulatory Visit (INDEPENDENT_AMBULATORY_CARE_PROVIDER_SITE_OTHER)

## 2024-05-14 ENCOUNTER — Encounter: Payer: Self-pay | Admitting: Podiatry

## 2024-05-14 VITALS — Ht 64.0 in | Wt 165.0 lb

## 2024-05-14 DIAGNOSIS — M21619 Bunion of unspecified foot: Secondary | ICD-10-CM

## 2024-05-14 DIAGNOSIS — D3613 Benign neoplasm of peripheral nerves and autonomic nervous system of lower limb, including hip: Secondary | ICD-10-CM

## 2024-05-14 DIAGNOSIS — M7751 Other enthesopathy of right foot: Secondary | ICD-10-CM

## 2024-05-14 MED ORDER — TRIAMCINOLONE ACETONIDE 10 MG/ML IJ SUSP
10.0000 mg | Freq: Once | INTRAMUSCULAR | Status: AC
  Administered 2024-05-14: 10 mg via INTRA_ARTICULAR

## 2024-05-15 NOTE — Progress Notes (Signed)
 Subjective:   Patient ID: Natalie Little, female   DOB: 69 y.o.   MRN: [id number]   HPI Patient states it feels like the second toe has moved significantly from last visit and it is lifted quite a bit.  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  At this time to try to get her relief through the holidays I carefully injected periarticular around the joint 2 mg dexamethasone  Kenalog  5 mg Xylocaine  and reviewed her x-ray.  I did educate her on bunion correction and MPJ fusion that we will most likely be necessary  X-rays indicate there is further elevation of the second digit right foot with severe bunion deformity noted

## 2024-07-29 ENCOUNTER — Ambulatory Visit: Admitting: Podiatry

## 2024-08-05 ENCOUNTER — Encounter: Payer: Self-pay | Admitting: Podiatry

## 2024-08-05 ENCOUNTER — Ambulatory Visit: Admitting: Podiatry

## 2024-08-05 DIAGNOSIS — M21619 Bunion of unspecified foot: Secondary | ICD-10-CM | POA: Diagnosis not present

## 2024-08-05 DIAGNOSIS — M2041 Other hammer toe(s) (acquired), right foot: Secondary | ICD-10-CM | POA: Diagnosis not present

## 2024-08-06 ENCOUNTER — Telehealth: Payer: Self-pay

## 2024-08-06 NOTE — Telephone Encounter (Signed)
 error

## 2024-08-06 NOTE — Progress Notes (Signed)
 Subjective:   Patient ID: Natalie Little, female   DOB: 69 y.o.   MRN: [id number]   HPI Patient states the medication helped my foot somewhat but I know I need surgery on this foot as it is getting worse the second toe is rising further the bunion is bothering me more in the joint of the second MPJ responded well to medicine but is reoccurring at this time   ROS      Objective:  Physical Exam  Neurovascular status intact with severe bunion deformity right deviation of the hallux rigid contracture digit to right with pressure against the MPJ with inflammation and fluid of the second MPJ.  The first MPJ is sore with motion of a mild nature.  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  This is a severe deformity and I have recommended most likely a first MPJ fusion versus Lapidus but I think most likely fusion will be necessary.  I also think second toe require stabilization with fusion and probable shortening of the second metatarsal.  Patient is referred to physician in the group who does this type of work and I educated Natalie Little on the procedure and the fact she will be nonweightbearing for a number of weeks postoperatively and the approximate 2 to 4-week range.  All questions answered and she is scheduled to see physician at this time

## 2024-08-17 ENCOUNTER — Ambulatory Visit: Admitting: Podiatry

## 2024-08-18 ENCOUNTER — Ambulatory Visit

## 2024-08-27 ENCOUNTER — Ambulatory Visit

## 2024-08-27 DIAGNOSIS — M2041 Other hammer toe(s) (acquired), right foot: Secondary | ICD-10-CM | POA: Diagnosis not present

## 2024-08-27 DIAGNOSIS — M21961 Unspecified acquired deformity of right lower leg: Secondary | ICD-10-CM | POA: Diagnosis not present

## 2024-08-27 DIAGNOSIS — M205X1 Other deformities of toe(s) (acquired), right foot: Secondary | ICD-10-CM | POA: Diagnosis not present

## 2024-08-27 DIAGNOSIS — M2011 Hallux valgus (acquired), right foot: Secondary | ICD-10-CM

## 2024-08-27 DIAGNOSIS — M7751 Other enthesopathy of right foot: Secondary | ICD-10-CM

## 2024-08-27 NOTE — Progress Notes (Signed)
 "  Subjective:  Patient ID: Natalie Little, female    DOB: 1956/04/29,  MRN: [id number]  Chief Complaint  Patient presents with   Foot Pain    Rm7 patient complains of right foot bunion and hammertoe pain for several weeks/no improvement constant pain.    Discussed the use of AI scribe software for clinical note transcription with the patient, who gave verbal consent to proceed.  History of Present Illness Natalie Little is a 69 year old female who presents with right foot pain and deformity due to hallux valgus and second hammer toe.  She has had right forefoot pain for at least one year, localized mainly to the medial bunion and second toe, with radiation from the bunion. Pain is significant and worsens with ambulation and shoe wear. She often bandages the area when walking or wearing shoes.  The second hammer toe is more painful than the bunion and also requires frequent bandaging. She has no pain in the lateral foot and toes three through five are asymptomatic. She notes only minimal tenderness on the plantar aspect of the right foot.  She has seen progressive worsening of the bunion deformity over several years, with the hallux deviating toward the second toe and elevating the second toe. Shoe modifications and other conservative measures have not relieved symptoms. She does not take oral analgesics.  Her left foot is asymptomatic. She denies left foot pain, though a family member has noticed possible early deformity of the left second toe.     Review of Systems: Negative except as noted in the HPI. Denies N/V/F/Ch.  Past Medical History:  Diagnosis Date   Anxiety    on meds   Cataract 12/2022   bilateral sx   Depression    bi polar-on meds   Current Medications[1]  Tobacco Use History[2]  Allergies[3] Objective:   Constitutional Well developed. Well nourished. Oriented to person, place, and time.  Vascular Dorsalis pedis pulses palpable bilaterally. Posterior tibial  pulses palpable bilaterally. Capillary refill normal to all digits.  No cyanosis or clubbing noted. Pedal hair growth normal.  Neurologic Normal speech. Epicritic sensation to light touch grossly present bilaterally. Negative tinel sign at tarsal tunnel bilaterally.   Dermatologic Skin texture and turgor are within normal limits.  No open wounds. No skin lesions.  Musculoskeletal: Normal joint ROM without pain or crepitus bilaterally. Rectus hindfoot.  Hallux abductovalgus deformity present Right 1st MPJ diminished range with pain  Right 1st TMT without gross hypermobility. Lesser digital contractures present - to the right second digit with dorsiflexion at the MTP, rigid hammertoe deformity.  Mild hammertoe deformities to digits 3 through 5 that are asymptomatic right.   Radiographs: Reviewed. Right Hallux abductovalgus deformity present. Metatarsal parabola normal. 1st/2nd IMA: 18.1; TSP: 7.  Hammertoe deformity present to the second digit.  No met adductus.  No other acute osseous findings such as fracture or dislocation.  Physical Exam       Assessment:   1. Capsulitis of metatarsophalangeal (MTP) joint of right foot   2. Hammer toe of right foot   3. Hallux limitus of right foot   4. Hallux valgus (acquired), right foot   5. Metatarsal deformity, right      Plan:  Patient was evaluated and treated and all questions answered.  Assessment and Plan Assessment & Plan Right foot hallux valgus and second hammer toe Chronic, severe hallux valgus with second hammer toe causing pain and functional limitations. Conservative management failed, necessitating surgery. - Recommended  first metatarsophalangeal joint arthrodesis, second metatarsal osteotomy, second hammertoe correction with PIPJ arthrodesis and flexor tendon transfer - Discussed outpatient surgery with same-day discharge and immediate protected weightbearing in a boot for 7-8 weeks, with gradual activity increase. -  Advised rest, ice, and elevation for first two weeks postoperatively with limited ambulation. - Planned removal of second toe pin after healing; plate to remain permanently. - Scheduled follow-up one week postoperatively for suture removal and healing assessment. - Discussed short course of oral analgesics postoperatively as needed. - Discussed option for popliteal nerve block for perioperative analgesia. She would like to proceed with this.  - Provided anticipatory guidance on recovery timeline and process. - Arranged surgical scheduling and preoperative documentation. - Dispensed CAM walker today for post-operative use, she will bring it to surgery  Informed surgical risk consent was reviewed and read aloud to the patient.  I reviewed the imaging.  I have discussed my findings with the patient in great detail.  I have discussed all risks including but not limited to infection, stiffness, scarring, limp, disability, deformity, damage to blood vessels and nerves, numbness, poor healing, need for braces, arthritis, chronic pain, amputation, and death.  All benefits and realistic expectations discussed in great detail.  I have made no promises as to the outcome.  I have provided realistic expectations.  I have offered the patient a 2nd opinion, which they have declined and assured me they preferred to proceed despite the risks.  Planned procedures: Right foot first metatarsophalangeal joint arthrodesis, second metatarsal osteotomy, second hammertoe repair via PIPJ arthrodesis and flexor tendon transfer with pin fixation  Post-operative weight bearing status: WBAT in CAM walker  VTE risk assessment/need for prophylaxis: No DVT prophylaxis indicated   Post-operative pain management: Oxycodone/Acetaminophen  5mg /325mg  q6h PRN  Patient risk factors that were discussed: none identified       No follow-ups on file.   Prentice Ovens, DPM AACFAS Fellowship Trained Podiatric Surgeon [facility]     [1]  Current Outpatient Medications:    buPROPion (WELLBUTRIN XL) 150 MG 24 hr tablet, Take 150 mg by mouth every morning., Disp: , Rfl:    busPIRone (BUSPAR) 30 MG tablet, Take 30 mg by mouth. Takes 20 mg in the morning , 20 mg at noon and 10 mg at night (tablet is broken in to 3rds), Disp: , Rfl:    butalbital-aspirin-caffeine (FIORINAL) 50-325-40 MG per capsule, Take 2 capsules by mouth as needed for migraine., Disp: , Rfl:    CALCIUM-VITAMIN D PO, Take 1 tablet by mouth daily at 6 (six) AM., Disp: , Rfl:    clonazepam (KLONOPIN) 0.125 MG disintegrating tablet, Take 0.125 mg by mouth every morning., Disp: , Rfl:    clonazePAM (KLONOPIN) 0.5 MG tablet, Take 0.5 mg by mouth at bedtime., Disp: , Rfl:    divalproex (DEPAKOTE ER) 250 MG 24 hr tablet, Take 750 mg by mouth at bedtime., Disp: , Rfl:    estradiol (CLIMARA - DOSED IN MG/24 HR) 0.05 mg/24hr patch, Place 1 patch onto the skin 2 (two) times a week. Patch cut in half, Disp: , Rfl:    gabapentin (NEURONTIN) 100 MG capsule, Take 100 mg by mouth in the morning and at bedtime. Takes 200mg  in the morning and 300mg  at bedtime, Disp: , Rfl:    lamoTRIgine (LAMICTAL) 25 MG tablet, Take 25 mg by mouth 2 (two) times daily. 175 mg in the morning and 75 mg in the evening, Disp: , Rfl:  MELATONIN PO, Take 20 mg by mouth at bedtime., Disp: , Rfl:    methocarbamol  (ROBAXIN ) 500 MG tablet, Take 1 tablet (500 mg total) by mouth 2 (two) times daily., Disp: 20 tablet, Rfl: 0   Multiple Vitamin (MULTIVITAMIN) capsule, Take 1 capsule by mouth daily., Disp: , Rfl:    naproxen  (NAPROSYN ) 500 MG tablet, Take 1 tablet (500 mg total) by mouth 2 (two) times daily., Disp: 30 tablet, Rfl: 0   sodium chloride  1 g tablet, Take 1 g by mouth daily., Disp: , Rfl:    solifenacin (VESICARE) 5 MG tablet, Take 5 mg by mouth daily., Disp: , Rfl:  [2]  Social History Tobacco Use  Smoking Status Never  Smokeless Tobacco Never  [3] No Known Allergies  "

## 2024-08-27 NOTE — Patient Instructions (Signed)

## 2024-09-03 ENCOUNTER — Telehealth: Payer: Self-pay

## 2024-09-03 NOTE — Telephone Encounter (Signed)
 Called and scheduled patient for surgery on 09/14/2024. Patient not on any GLP1 or blood thinners, pharmacy correct in chart.

## 2024-09-22 ENCOUNTER — Encounter

## 2024-10-06 ENCOUNTER — Encounter
